# Patient Record
Sex: Female | Born: 1960 | Race: White | Hispanic: No | Marital: Single | Smoking: Former smoker
Health system: Southern US, Community
[De-identification: ages and names within clinical notes are randomized; demographics above are authoritative.]

## PROBLEM LIST (undated history)

## (undated) DIAGNOSIS — J449 Chronic obstructive pulmonary disease, unspecified: Secondary | ICD-10-CM

## (undated) DIAGNOSIS — J439 Emphysema, unspecified: Secondary | ICD-10-CM

## (undated) HISTORY — PX: APPENDECTOMY: SHX54

---

## 2020-08-30 ENCOUNTER — Other Ambulatory Visit: Payer: Self-pay

## 2020-08-30 ENCOUNTER — Emergency Department
Admission: EM | Admit: 2020-08-30 | Discharge: 2020-08-30 | Disposition: A | Discharge status: Home / Self Care | Payer: Self-pay | Source: Home / Self Care

## 2020-08-30 DIAGNOSIS — R0789 Other chest pain: Secondary | ICD-10-CM | POA: Diagnosis not present

## 2020-08-30 DIAGNOSIS — T148XXA Other injury of unspecified body region, initial encounter: Secondary | ICD-10-CM

## 2020-08-30 DIAGNOSIS — M5412 Radiculopathy, cervical region: Secondary | ICD-10-CM

## 2020-08-30 MED ORDER — DEXAMETHASONE SODIUM PHOSPHATE 10 MG/ML IJ SOLN
10.0000 mg | Freq: Once | INTRAMUSCULAR | Status: AC
  Administered 2020-08-30: 10 mg via INTRAMUSCULAR

## 2020-08-30 MED ORDER — KETOROLAC TROMETHAMINE 30 MG/ML IJ SOLN
30.0000 mg | Freq: Once | INTRAMUSCULAR | Status: AC
  Administered 2020-08-30: 30 mg via INTRAMUSCULAR

## 2020-08-30 MED ORDER — PREDNISONE 10 MG (21) PO TBPK: ORAL_TABLET | Freq: Every day | ORAL | 0 refills | Status: AC

## 2020-08-30 NOTE — ED Provider Notes (Signed)
[facility]    CSN: [id number] Arrival date & time: 08/30/20  1511      History   Chief Complaint Chief Complaint  Patient presents with  . Chest Pain    X3 days    HPI Penny Jenkins is a 60 y.o. female.   Reports left sided chest pain and left arm numbness and tingling intermittently for the last 3 days. Reports recent fall at work but that she did not catch herself with her left side. Reports that she has had musculoskeletal pain issues in the past and has previously diagnosed with polymyalgia and bursitis of both hips. Has been taking ibuprofen for this with little relief. Reports that her left chest is tender to touch and that pain is worse with deep breathing. Denies limited ROM. Denies fatigue, diaphoresis, changes in strength, changes in vision, headaches, back pain, jaw pain, epigastric pain. Denies personal cardiac history, does not take prescription medications for anything. Denies family cardiac history as well.  ROS per HPI  The history is provided by the patient.    History reviewed. No pertinent past medical history.  There are no problems to display for this patient.   History reviewed. No pertinent surgical history.  OB History   No obstetric history on file.      Home Medications    Prior to Admission medications   Medication Sig Start Date End Date Taking? Authorizing Provider  predniSONE (STERAPRED UNI-PAK 21 TAB) 10 MG (21) TBPK tablet Take by mouth daily for 6 days. Take 6 tablets on day 1, 5 tablets on day 2, 4 tablets on day 3, 3 tablets on day 4, 2 tablets on day 5, 1 tablet on day 6 08/30/20 09/05/20 Yes Moshe Cipro, NP    Family History Family History  Problem Relation Age of Onset  . Hypertension Mother   . Asthma Mother     Social History Social History   Tobacco Use  . Smoking status: Never Smoker  . Smokeless tobacco: Never Used  Substance Use Topics  . Alcohol use: Not Currently     Allergies    Codeine   Review of Systems Review of Systems   Physical Exam Triage Vital Signs ED Triage Vitals  Enc Vitals Group     BP 08/30/20 1525 125/83     Pulse Rate 08/30/20 1525 64     Resp 08/30/20 1525 16     Temp 08/30/20 1525 98.1 F (36.7 C)     Temp Source 08/30/20 1525 Oral     SpO2 08/30/20 1525 99 %     Weight --      Height --      Head Circumference --      Peak Flow --      Pain Score 08/30/20 1523 4     Pain Loc --      Pain Edu? --      Excl. in GC? --    No data found.  Updated Vital Signs BP 125/83 (BP Location: Right Arm)   Pulse 64   Temp 98.1 F (36.7 C) (Oral)   Resp 16   SpO2 99%      Physical Exam Vitals and nursing note reviewed.  Constitutional:      General: She is not in acute distress.    Appearance: She is well-developed. She is not ill-appearing, toxic-appearing or diaphoretic.  HENT:     Head: Normocephalic and atraumatic.  Eyes:     Conjunctiva/sclera: Conjunctivae normal.  Cardiovascular:     Rate and Rhythm: Normal rate and regular rhythm.     Pulses:          Carotid pulses are 2+ on the right side and 2+ on the left side.      Radial pulses are 2+ on the right side and 2+ on the left side.       Dorsalis pedis pulses are 2+ on the right side and 2+ on the left side.     Heart sounds: Normal heart sounds. Heart sounds not distant. No murmur heard.  No systolic murmur is present.  No diastolic murmur is present. No friction rub. No gallop. No S3 or S4 sounds.   Pulmonary:     Effort: Pulmonary effort is normal. No tachypnea, accessory muscle usage or respiratory distress.     Breath sounds: Normal breath sounds. No stridor.  Abdominal:     Palpations: Abdomen is soft.     Tenderness: There is no abdominal tenderness.  Musculoskeletal:     Cervical back: Neck supple.     Right lower leg: No tenderness. No edema.     Left lower leg: No tenderness. No edema.     Comments: Left chest wall tenderness, posterior L neck  tenderness  Skin:    General: Skin is warm and dry.     Capillary Refill: Capillary refill takes less than 2 seconds.  Neurological:     General: No focal deficit present.     Mental Status: She is alert.  Psychiatric:        Mood and Affect: Mood normal.        Behavior: Behavior normal.      UC Treatments / Results  Labs (all labs ordered are listed, but only abnormal results are displayed) Labs Reviewed - No data to display  EKG   Radiology No results found.  Procedures Procedures (including critical care time)  Medications Ordered in UC Medications  dexamethasone (DECADRON) injection 10 mg (10 mg Intramuscular Given 08/30/20 1558)  ketorolac (TORADOL) 30 MG/ML injection 30 mg (30 mg Intramuscular Given 08/30/20 1559)    Initial Impression / Assessment and Plan / UC Course  I have reviewed the triage vital signs and the nursing notes.  Pertinent labs & imaging results that were available during my care of the patient were reviewed by me and considered in my medical decision making (see chart for details).    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 We have also given you decadron as a steroid.  I have sent in a prednisone taper for you to take for 6 days. 6 tablets on day one, 5 tablets on day  two, 4 tablets on day three, 3 tablets on day four, 2 tablets on day five, and 1 tablet  on day six.  Follow up with this office or with primary care if symptoms are persisting.  Follow up in the ER for high fever, trouble swallowing, trouble breathing, other concerning symptoms.     ED Prescriptions    Medication Sig Dispense Auth. Provider   predniSONE (STERAPRED UNI-PAK 21 TAB) 10 MG (21) TBPK tablet Take by mouth daily for 6 days. Take 6 tablets on day 1, 5 tablets on day 2, 4 tablets on day 3, 3 tablets on day 4, 2 tablets on day 5, 1 tablet on day 6 21 tablet Moshe Cipro, NP     PDMP not reviewed this encounter.   Moshe Cipro, NP 08/30/20 1600

## 2020-08-30 NOTE — ED Triage Notes (Signed)
Patient presents to Urgent Care with complaints of sharp chest pain that occasionally radiates down her left arm since 3-4 days ago. Patient reports the pain sometimes wakes her up out of her sleep. Denies cardiac history.

## 2020-08-30 NOTE — Discharge Instructions (Addendum)
We have given you toradol as an injection for pain  in the office today. We have also given you decadron as a steroid.  I have sent in a prednisone taper for you to take for 6 days. 6 tablets on day one, 5 tablets on day two, 4 tablets on day three, 3 tablets on day four, 2 tablets on day five, and 1 tablet on day six.  Follow up with this office or with primary care if symptoms are persisting.  Follow up in the ER for high fever, trouble swallowing, trouble breathing, other concerning symptoms.

## 2020-11-07 ENCOUNTER — Other Ambulatory Visit: Payer: Self-pay

## 2020-11-07 ENCOUNTER — Emergency Department
Admission: EM | Admit: 2020-11-07 | Discharge: 2020-11-07 | Disposition: A | Discharge status: Home / Self Care | Payer: 59 | Source: Home / Self Care

## 2020-11-07 ENCOUNTER — Emergency Department (INDEPENDENT_AMBULATORY_CARE_PROVIDER_SITE_OTHER): Payer: 59

## 2020-11-07 DIAGNOSIS — M79671 Pain in right foot: Secondary | ICD-10-CM | POA: Diagnosis not present

## 2020-11-07 DIAGNOSIS — S92535A Nondisplaced fracture of distal phalanx of left lesser toe(s), initial encounter for closed fracture: Secondary | ICD-10-CM | POA: Diagnosis not present

## 2020-11-07 DIAGNOSIS — W19XXXA Unspecified fall, initial encounter: Secondary | ICD-10-CM | POA: Diagnosis not present

## 2020-11-07 NOTE — ED Provider Notes (Signed)
[facility]    CSN: [id number] Arrival date & time: 11/07/20  0800      History   Chief Complaint Chief Complaint  Patient presents with  . Foot Injury    HPI Penny Jenkins is a 60 y.o. female.   [place].  Pt complains of swelling and pain   The history is provided by the patient. No language interpreter was used.  Foot Injury Location:  Toe Injury: yes   Mechanism of injury: fall   Fall:    Entrapped after fall: no   Toe location:  L fourth toe and L little toe   History reviewed. No pertinent past medical history.  There are no problems to display for this patient.   Past Surgical History:  Procedure Laterality Date  . APPENDECTOMY    . CESAREAN SECTION      OB History   No obstetric history on file.      Home Medications    Prior to Admission medications   Medication Sig Start Date End Date Taking? Authorizing Provider  ibuprofen (ADVIL) 400 MG tablet Take 400 mg by mouth every 6 (six) hours as needed.   Yes [provider]    Family History Family History  Problem Relation Age of Onset  . Hypertension Mother   . Asthma Mother   . Cancer Father     Social History Social History   Tobacco Use  . Smoking status: Former Games developer  . Smokeless tobacco: Never Used  Vaping Use  . Vaping Use: Never used  Substance Use Topics  . Alcohol use: Not Currently  . Drug use: Not Currently     Allergies   Codeine   Review of Systems Review of Systems  All other systems reviewed and are negative.    Physical Exam Triage Vital Signs ED Triage Vitals  Enc Vitals Group     BP 11/07/20 0817 (!) 130/93     Pulse Rate 11/07/20 0817 72     Resp 11/07/20 0817 18     Temp 11/07/20 0817 98.3 F (36.8 C)     Temp Source 11/07/20 0817 Oral     SpO2 11/07/20 0817 97 %     Weight 11/07/20 0816 150 lb (68 kg)     Height 11/07/20 0816 5\' 5"  (1.651 m)     Head Circumference --       Peak Flow --      Pain Score 11/07/20 0815 5     Pain Loc --      Pain Edu? --      Excl. in GC? --    No data found.  Updated Vital Signs BP (!) 130/93 (BP Location: Right Arm)   Pulse 72   Temp 98.3 F (36.8 C) (Oral)   Resp 18   Ht 5\' 5"  (1.651 m)   Wt 68 kg   SpO2 97%   BMI 24.96 kg/m   Visual Acuity Right Eye Distance:   Left Eye Distance:   Bilateral Distance:    Right Eye Near:   Left Eye Near:    Bilateral Near:     Physical Exam Vitals and nursing note reviewed.  Constitutional:      Appearance: She is well-developed.  HENT:     Head: Normocephalic.  Pulmonary:     Effort: Pulmonary effort is normal.  Abdominal:     General: There is no distension.  Musculoskeletal:  General: Swelling and tenderness present.     Cervical back: Normal range of motion.     Comments: Left 4th and 5th toe  From nv and ns intact  Skin:    General: Skin is warm.  Neurological:     General: No focal deficit present.     Mental Status: She is alert and oriented to person, place, and time.  Psychiatric:        Mood and Affect: Mood normal.      UC Treatments / Results  Labs (all labs ordered are listed, but only abnormal results are displayed) Labs Reviewed - No data to display  EKG   Radiology DG Foot Complete Right  Result Date: 11/07/2020 CLINICAL DATA:  Pain following fall EXAM: RIGHT FOOT COMPLETE - 3+ VIEW COMPARISON:  None. FINDINGS: Frontal, oblique, and lateral views were obtained. There is a subtle transversely oriented fracture of the distal aspect of the fifth proximal phalanx. Alignment anatomic in this area. No other fracture. No dislocation. No appreciable joint space narrowing or erosion. There is an inferior calcaneal spur. Mild bunion formation medial first MTP joint level. IMPRESSION: Nondisplaced fracture of the distal aspect of the fifth proximal phalanx. No other evident fracture. No dislocation. Inferior calcaneal spur. Mild bunion  formation medial first MTP joint level. These results will be called to the ordering clinician or representative by the Radiologist Assistant, and communication documented in the PACS or [employer]. Electronically Signed   By: Bretta Bang III M.D.   On: 11/07/2020 08:37    Procedures Procedures (including critical care time)  Medications Ordered in UC Medications - No data to display  Initial Impression / Assessment and Plan / UC Course  I have reviewed the triage vital signs and the nursing notes.  Pertinent labs & imaging results that were available during my care of the patient were reviewed by me and considered in my medical decision making (see chart for details).      Final Clinical Impressions(s) / UC Diagnoses   Final diagnoses:  Closed nondisplaced fracture of distal phalanx of lesser toe of left foot, initial encounter   Discharge Instructions   None    ED Prescriptions    None     PDMP not reviewed this encounter.   Elson Areas, [place] 11/07/20 0254

## 2020-11-07 NOTE — Discharge Instructions (Signed)
Return if any problems.

## 2020-11-07 NOTE — ED Triage Notes (Signed)
Pt presents to Urgent Care with c/o R 4th and 5th toe pain following injury yesterday. [place]. Swelling and redness noted to both toes.

## 2021-05-08 ENCOUNTER — Encounter: Payer: Self-pay | Admitting: Emergency Medicine

## 2021-05-08 ENCOUNTER — Emergency Department
Admission: EM | Admit: 2021-05-08 | Discharge: 2021-05-08 | Disposition: A | Discharge status: Home / Self Care | Payer: Self-pay | Source: Home / Self Care

## 2021-05-08 ENCOUNTER — Other Ambulatory Visit: Payer: Self-pay

## 2021-05-08 DIAGNOSIS — B349 Viral infection, unspecified: Secondary | ICD-10-CM

## 2021-05-08 DIAGNOSIS — R509 Fever, unspecified: Secondary | ICD-10-CM

## 2021-05-08 DIAGNOSIS — R059 Cough, unspecified: Secondary | ICD-10-CM

## 2021-05-08 MED ORDER — BENZONATATE 200 MG PO CAPS: 200.0000 mg | ORAL_CAPSULE | Freq: Three times a day (TID) | ORAL | 0 refills | Status: AC | PRN

## 2021-05-08 MED ORDER — OSELTAMIVIR PHOSPHATE 75 MG PO CAPS: 75.0000 mg | ORAL_CAPSULE | Freq: Two times a day (BID) | ORAL | 0 refills | Status: DC

## 2021-05-08 MED ORDER — PREDNISONE 20 MG PO TABS: ORAL_TABLET | ORAL | 0 refills | Status: DC

## 2021-05-08 NOTE — ED Provider Notes (Signed)
[facility]    CSN: [id number] Arrival date & time: 05/08/21  0808      History   Chief Complaint Chief Complaint  Patient presents with   Fever   Cough    HPI Penny Jenkins is a 60 y.o. female.   HPI 60 year old female presents with fever, cough, congestion for 3 days.  Reports temperature max last night was 100.9.  Patient is currently at 99.9.  History reviewed. No pertinent past medical history.  There are no problems to display for this patient.   Past Surgical History:  Procedure Laterality Date   APPENDECTOMY     CESAREAN SECTION      OB History   No obstetric history on file.      Home Medications    Prior to Admission medications   Medication Sig Start Date End Date Taking? Authorizing Provider  benzonatate (TESSALON) 200 MG capsule Take 1 capsule (200 mg total) by mouth 3 (three) times daily as needed for up to 7 days for cough. 05/08/21 05/15/21 Yes Trevor Iha, FNP  oseltamivir (TAMIFLU) 75 MG capsule Take 1 capsule (75 mg total) by mouth every 12 (twelve) hours. 05/08/21  Yes Trevor Iha, FNP  predniSONE (DELTASONE) 20 MG tablet Take 3 tabs PO daily x 5 days. 05/08/21  Yes Trevor Iha, FNP  ibuprofen (ADVIL) 400 MG tablet Take 400 mg by mouth every 6 (six) hours as needed.    [provider]    Family History Family History  Problem Relation Age of Onset   Hypertension Mother    Asthma Mother    Cancer Father     Social History Social History   Tobacco Use   Smoking status: Former   Smokeless tobacco: Never  Building services engineer Use: Never used  Substance Use Topics   Alcohol use: Not Currently   Drug use: Not Currently     Allergies   Codeine   Review of Systems Review of Systems  Constitutional:  Positive for fever.  HENT:  Positive for congestion.   Respiratory:  Positive for cough.   All other systems reviewed and are negative.   Physical Exam Triage Vital Signs ED Triage Vitals   Enc Vitals Group     BP 05/08/21 0820 107/75     Pulse Rate 05/08/21 0820 90     Resp 05/08/21 0820 16     Temp 05/08/21 0820 99.9 F (37.7 C)     Temp Source 05/08/21 0820 Oral     SpO2 05/08/21 0820 97 %     Weight 05/08/21 0823 160 lb (72.6 kg)     Height 05/08/21 0823 5\' 5"  (1.651 m)     Head Circumference --      Peak Flow --      Pain Score 05/08/21 0822 4     Pain Loc --      Pain Edu? --      Excl. in GC? --    No data found.  Updated Vital Signs BP 107/75 (BP Location: Left Arm)   Pulse 90   Temp 99.9 F (37.7 C) (Oral)   Resp 16   Ht 5\' 5"  (1.651 m)   Wt 160 lb (72.6 kg)   SpO2 97%   BMI 26.63 kg/m   Physical Exam Vitals and nursing note reviewed.  Constitutional:      General: She is not in acute distress.    Appearance: Normal appearance. She is normal weight. She is not  ill-appearing.  HENT:     Head: Normocephalic and atraumatic.     Right Ear: Tympanic membrane, ear canal and external ear normal.     Left Ear: Tympanic membrane, ear canal and external ear normal.     Mouth/Throat:     Mouth: Mucous membranes are moist.     Pharynx: Oropharynx is clear.  Eyes:     Extraocular Movements: Extraocular movements intact.     Conjunctiva/sclera: Conjunctivae normal.     Pupils: Pupils are equal, round, and reactive to light.  Cardiovascular:     Rate and Rhythm: Normal rate and regular rhythm.     Pulses: Normal pulses.     Heart sounds: Normal heart sounds.  Pulmonary:     Effort: Pulmonary effort is normal.     Breath sounds: Normal breath sounds. No wheezing, rhonchi or rales.     Comments: Infrequent nonproductive cough noted on exam Musculoskeletal:        General: Normal range of motion.     Cervical back: Normal range of motion and neck supple.  Skin:    General: Skin is warm and dry.  Neurological:     General: No focal deficit present.     Mental Status: She is alert and oriented to person, place, and time.     UC Treatments /  Results  Labs (all labs ordered are listed, but only abnormal results are displayed) Labs Reviewed - No data to display  EKG   Radiology No results found.  Procedures Procedures (including critical care time)  Medications Ordered in UC Medications - No data to display  Initial Impression / Assessment and Plan / UC Course  I have reviewed the triage vital signs and the nursing notes.  Pertinent labs & imaging results that were available during my care of the patient were reviewed by me and considered in my medical decision making (see chart for details).     MDM: 1.  Fever-advised patient may use OTC Tylenol 1000 mg 1-2 times daily, as needed; 2.  Viral illness-Rx Tamiflu; 3.  Cough-Rx'd prednisone burst and Tessalon Perles. Advised patient to take medication as directed with food to completion.  Encouraged patient to increase daily water intake while taking these medications.  Provided per patient request.  Patient discharged home, hemodynamically stable. Final Clinical Impressions(s) / UC Diagnoses   Final diagnoses:  Fever, unspecified  Viral illness  Cough, unspecified type     Discharge Instructions      Advised patient to take medication as directed with food to completion.  Advised patient may use OTC Tylenol 1000 mg 1-2 times daily, as needed. Encouraged patient to increase daily water intake while taking these medications.     ED Prescriptions     Medication Sig Dispense Auth. Provider   oseltamivir (TAMIFLU) 75 MG capsule Take 1 capsule (75 mg total) by mouth every 12 (twelve) hours. 10 capsule Trevor Iha, FNP   predniSONE (DELTASONE) 20 MG tablet Take 3 tabs PO daily x 5 days. 15 tablet Trevor Iha, FNP   benzonatate (TESSALON) 200 MG capsule Take 1 capsule (200 mg total) by mouth 3 (three) times daily as needed for up to 7 days for cough. 40 capsule Trevor Iha, FNP      PDMP not reviewed this encounter.   Trevor Iha, FNP 05/08/21  [phone]

## 2021-05-08 NOTE — ED Triage Notes (Signed)
Fever last night 100.9  Cough & congestion since sunday  Thera flu OTC  Negative home covid x 2 - last one was yesterday  Headache & nausea - last Wed - 1st Covid test was negative  No flu or Covid vaccine

## 2021-05-08 NOTE — Discharge Instructions (Addendum)
Advised patient to take medication as directed with food to completion.  Advised patient may use OTC Tylenol 1000 mg 1-2 times daily, as needed. Encouraged patient to increase daily water intake while taking these medications.

## 2021-09-11 ENCOUNTER — Emergency Department
Admission: EM | Admit: 2021-09-11 | Discharge: 2021-09-11 | Disposition: A | Discharge status: Home / Self Care | Payer: Self-pay | Source: Home / Self Care

## 2021-09-11 ENCOUNTER — Emergency Department (INDEPENDENT_AMBULATORY_CARE_PROVIDER_SITE_OTHER): Payer: Self-pay

## 2021-09-11 DIAGNOSIS — M7989 Other specified soft tissue disorders: Secondary | ICD-10-CM

## 2021-09-11 NOTE — ED Triage Notes (Signed)
Pt c/o lower RT leg swelling x 1 week. States swells all the way up to her RT knee. Sometimes toes start to tingle. Had some leg swelling several years ago, resolved on its own. ?

## 2021-09-11 NOTE — ED Provider Notes (Signed)
?[facility] ? ? ? ?CSN: [id number] ?Arrival date & time: 09/11/21  2202 ? ? ?  ? ?History   ?Chief Complaint ?Chief Complaint  ?Patient presents with  ? Leg Swelling  ?  RT  ? ? ?HPI ?Penny Jenkins is a 61 y.o. female.  ? ?HPI 61 year old female presents with right lower leg swelling for for 1 week.  Patient reports swelling goes all the way to her right knee.  Reports infrequently feels her toes starting to 10.  Patient reports similar episode several years ago which resolved on its own.  PMH significant for polymyalgia rheumatica.  Patient reports that she works for [employer] but mainly sits behind a desk during the workday.  Patient denies anginal equivalents, claudication, shortness of breath, lightheadedness/dizziness, presyncopal or syncopal episodes. ? ?History reviewed. No pertinent past medical history. ? ?There are no problems to display for this patient. ? ? ?Past Surgical History:  ?Procedure Laterality Date  ? APPENDECTOMY    ? CESAREAN SECTION    ? ? ?OB History   ?No obstetric history on file. ?  ? ? ? ?Home Medications   ? ?Prior to Admission medications   ?Medication Sig Start Date End Date Taking? Authorizing Provider  ?ibuprofen (ADVIL) 400 MG tablet Take 400 mg by mouth every 6 (six) hours as needed.    [provider]  ?oseltamivir (TAMIFLU) 75 MG capsule Take 1 capsule (75 mg total) by mouth every 12 (twelve) hours. 05/08/21   Trevor Iha, FNP  ?predniSONE (DELTASONE) 20 MG tablet Take 3 tabs PO daily x 5 days. 05/08/21   Trevor Iha, FNP  ? ? ?Family History ?Family History  ?Problem Relation Age of Onset  ? Hypertension Mother   ? Asthma Mother   ? Cancer Father   ? ? ?Social History ?Social History  ? ?Tobacco Use  ? Smoking status: Former  ? Smokeless tobacco: Never  ?Vaping Use  ? Vaping Use: Never used  ?Substance Use Topics  ? Alcohol use: Not Currently  ? Drug use: Not Currently  ? ? ? ?Allergies   ?Codeine ? ? ?Review of Systems ?Review of Systems   ?Musculoskeletal:  Positive for joint swelling.  ?     Right lower leg swelling for 1 week  ?All other systems reviewed and are negative. ? ? ?Physical Exam ?Triage Vital Signs ?ED Triage Vitals  ?Enc Vitals Group  ?   BP 09/11/21 0903 112/75  ?   Pulse Rate 09/11/21 0903 75  ?   Resp 09/11/21 0903 18  ?   Temp 09/11/21 0903 98.3 ?F (36.8 ?C)  ?   Temp Source 09/11/21 0903 Oral  ?   SpO2 09/11/21 0903 97 %  ?   Weight --   ?   Height --   ?   Head Circumference --   ?   Peak Flow --   ?   Pain Score 09/11/21 0905 3  ?   Pain Loc --   ?   Pain Edu? --   ?   Excl. in GC? --   ? ?No data found. ? ?Updated Vital Signs ?BP 112/75 (BP Location: Right Arm)   Pulse 75   Temp 98.3 ?F (36.8 ?C) (Oral)   Resp 18   SpO2 97%  ? ? ?Physical Exam ?Vitals and nursing note reviewed.  ?Constitutional:   ?   General: She is not in acute distress. ?   Appearance: Normal appearance. She is normal weight. She  is not ill-appearing.  ?HENT:  ?   Head: Normocephalic and atraumatic.  ?   Mouth/Throat:  ?   Mouth: Mucous membranes are moist.  ?   Pharynx: Oropharynx is clear.  ?Eyes:  ?   Extraocular Movements: Extraocular movements intact.  ?   Conjunctiva/sclera: Conjunctivae normal.  ?   Pupils: Pupils are equal, round, and reactive to light.  ?Cardiovascular:  ?   Rate and Rhythm: Normal rate and regular rhythm.  ?   Pulses: Normal pulses.  ?   Heart sounds: Normal heart sounds. No murmur heard. ?   Comments: Right/Left PT/DP +1 bounding ?Pulmonary:  ?   Effort: Pulmonary effort is normal.  ?   Breath sounds: Normal breath sounds. No wheezing, rhonchi or rales.  ?Musculoskeletal:  ?   Cervical back: Normal range of motion and neck supple.  ?   Right lower leg: Edema present.  ?   Comments: Right lower leg circumference at mid gastroc-15.25 inches; left lower leg circumference at mid gastroc-14.75 inches, superficial thrombophlebitis and varicose veins noted  ?Skin: ?   General: Skin is warm and dry.  ?Neurological:  ?   General: No  focal deficit present.  ?   Mental Status: She is alert and oriented to person, place, and time.  ? ? ? ?UC Treatments / Results  ?Labs ?(all labs ordered are listed, but only abnormal results are displayed) ?Labs Reviewed - No data to display ? ?EKG ? ? ?Radiology ?US Venous Img Lower Unilateral Right ? ?Result Date: 09/11/2021 ?CLINICAL DATA:  61 year old female with a history of right lower leg swelling EXAM: RIGHT LOWER EXTREMITY VENOUS DOPPLER ULTRASOUND TECHNIQUE: Gray-scale sonography with graded compression, as well as color Doppler and duplex ultrasound were performed to evaluate the lower extremity deep venous systems from the level of the common femoral vein and including the common femoral, femoral, profunda femoral, popliteal and calf veins including the posterior tibial, peroneal and gastrocnemius veins when visible. The superficial great saphenous vein was also interrogated. Spectral Doppler was utilized to evaluate flow at rest and with distal augmentation maneuvers in the common femoral, femoral and popliteal veins. COMPARISON:  None. FINDINGS: Contralateral Common Femoral Vein: Respiratory phasicity is normal and symmetric with the symptomatic side. No evidence of thrombus. Normal compressibility. Common Femoral Vein: No evidence of thrombus. Normal compressibility, respiratory phasicity and response to augmentation. Saphenofemoral Junction: No evidence of thrombus. Normal compressibility and flow on color Doppler imaging. Profunda Femoral Vein: No evidence of thrombus. Normal compressibility and flow on color Doppler imaging. Femoral Vein: No evidence of thrombus. Normal compressibility, respiratory phasicity and response to augmentation. Popliteal Vein: No evidence of thrombus. Normal compressibility, respiratory phasicity and response to augmentation. Calf Veins: Limited visualization of the peroneal vein. Posterior tibial veins patent and compressible with flow maintained Superficial Great  Saphenous Vein: No evidence of thrombus. Normal compressibility and flow on color Doppler imaging. Other Findings:  None. IMPRESSION: Directed duplex of the right lower extremity negative for DVT Electronically Signed   By: Gilmer MorJaime  Wagner D.O.   On: 09/11/2021 10:42   ? ?Procedures ?Procedures (including critical care time) ? ?Medications Ordered in UC ?Medications - No data to display ? ?Initial Impression / Assessment and Plan / UC Course  ?I have reviewed the triage vital signs and the nursing notes. ? ?Pertinent labs & imaging results that were available during my care of the patient were reviewed by me and considered in my medical decision making (see chart for details). ? ?  ? ?  MDM: 1.  Right leg swelling-ultrasound negative for DVT. Advised/informed patient of ultrasound results today.  Advised patient if symptoms worsen and/or unresolved please follow-up with PCP or here for further evaluation.  Patient discharged home, hemodynamically stable. ?Final Clinical Impressions(s) / UC Diagnoses  ? ?Final diagnoses:  ?Right leg swelling  ? ? ? ?Discharge Instructions   ? ?  ?Advised/informed patient of ultrasound results today.  Advised patient if symptoms worsen and/or unresolved please follow-up with PCP or here for further evaluation. ? ? ? ? ?ED Prescriptions   ?None ?  ? ?PDMP not reviewed this encounter. ?  ?Trevor Iha, FNP ?09/11/21 1117 ? ?

## 2021-09-11 NOTE — Discharge Instructions (Addendum)
Advised/informed patient of ultrasound results today.  Advised patient if symptoms worsen and/or unresolved please follow-up with PCP or here for further evaluation. ?

## 2022-05-28 ENCOUNTER — Encounter: Payer: Self-pay | Admitting: Emergency Medicine

## 2022-05-28 ENCOUNTER — Ambulatory Visit
Admission: EM | Admit: 2022-05-28 | Discharge: 2022-05-28 | Disposition: A | Discharge status: Home / Self Care | Payer: Self-pay | Attending: Family Medicine | Admitting: Family Medicine

## 2022-05-28 DIAGNOSIS — Z20828 Contact with and (suspected) exposure to other viral communicable diseases: Secondary | ICD-10-CM | POA: Insufficient documentation

## 2022-05-28 DIAGNOSIS — J209 Acute bronchitis, unspecified: Secondary | ICD-10-CM | POA: Insufficient documentation

## 2022-05-28 DIAGNOSIS — Z1152 Encounter for screening for COVID-19: Secondary | ICD-10-CM | POA: Insufficient documentation

## 2022-05-28 LAB — RESP PANEL BY RT-PCR (FLU A&B, COVID) ARPGX2
Influenza A by PCR: NEGATIVE
Influenza B by PCR: NEGATIVE
SARS Coronavirus 2 by RT PCR: NEGATIVE

## 2022-05-28 MED ORDER — BENZONATATE 200 MG PO CAPS: 200.0000 mg | ORAL_CAPSULE | Freq: Three times a day (TID) | ORAL | 0 refills | Status: DC | PRN

## 2022-05-28 MED ORDER — PREDNISONE 20 MG PO TABS: 20.0000 mg | ORAL_TABLET | Freq: Two times a day (BID) | ORAL | 0 refills | Status: DC

## 2022-05-28 NOTE — Discharge Instructions (Signed)
Make sure you are drinking lots of fluids Take the Tessalon 2-3 times a day as needed for cough May take in addition Mucinex DM if needed Take prednisone 2 times a day.  This reduces inflammation and swelling Check MyChart for test results.  You will be called if any of your results are positive

## 2022-05-28 NOTE — ED Triage Notes (Addendum)
Cough since Friday night at a friends house EMS called - no trx Coughing started after lighting candles Steroid shot to R shoulder 2 days prior  Denies fever Honey OTC  Exposure to RSV

## 2022-05-28 NOTE — ED Provider Notes (Signed)
[facility]    CSN: [id number] Arrival date & time: 05/28/22  0933      History   Chief Complaint Chief Complaint  Patient presents with   Cough    HPI Penny Jenkins is a 61 y.o. female.   HPI  Patient states she developed a cough on Friday.  It was when she was visiting a friend who had numerous candles that were very aromatic.  At first she thought she was coughing due to the fragrance.  She has continued to cough.  Some runny nose.  She does have exposure to RSV, her mother is being treated for pneumonia and RSV.  He does not get vaccinations.  Has not had COVID or influenza shots.  Has not noticed any fever.  She does feel tired  History reviewed. No pertinent past medical history.  There are no problems to display for this patient.   Past Surgical History:  Procedure Laterality Date   APPENDECTOMY     CESAREAN SECTION      OB History   No obstetric history on file.      Home Medications    Prior to Admission medications   Medication Sig Start Date End Date Taking? Authorizing Provider  benzonatate (TESSALON) 200 MG capsule Take 1 capsule (200 mg total) by mouth 3 (three) times daily as needed for cough. 05/28/22  Yes Eustace Moore, MD  predniSONE (DELTASONE) 20 MG tablet Take 1 tablet (20 mg total) by mouth 2 (two) times daily with a meal. 05/28/22  Yes Eustace Moore, MD    Family History Family History  Problem Relation Age of Onset   Hypertension Mother    Asthma Mother    Cancer Father     Social History Social History   Tobacco Use   Smoking status: Former   Smokeless tobacco: Never  Building services engineer Use: Never used  Substance Use Topics   Alcohol use: Not Currently   Drug use: Not Currently     Allergies   Codeine   Review of Systems Review of Systems See HPI  Physical Exam Triage Vital Signs ED Triage Vitals  Enc Vitals Group     BP 05/28/22 1107 (!) 151/92     Pulse Rate 05/28/22 1107 84      Resp 05/28/22 1107 16     Temp 05/28/22 1107 98.8 F (37.1 C)     Temp Source 05/28/22 1107 Oral     SpO2 05/28/22 1107 99 %     Weight 05/28/22 1108 160 lb 0.9 oz (72.6 kg)     Height 05/28/22 1108 5\' 5"  (1.651 m)     Head Circumference --      Peak Flow --      Pain Score 05/28/22 1105 3     Pain Loc --      Pain Edu? --      Excl. in GC? --    No data found.  Updated Vital Signs BP (!) 151/92 (BP Location: Left Arm)   Pulse 84   Temp 98.8 F (37.1 C) (Oral)   Resp 16   Ht 5\' 5"  (1.651 m)   Wt 72.6 kg   SpO2 99%   BMI 26.63 kg/m   Physical Exam Constitutional:      General: She is not in acute distress.    Appearance: Normal appearance. She is well-developed. She is not ill-appearing.  HENT:     Head: Normocephalic and atraumatic.  Right Ear: Tympanic membrane and external ear normal.     Left Ear: Tympanic membrane and external ear normal.     Nose: Nose normal. No congestion.     Mouth/Throat:     Mouth: Mucous membranes are moist.     Pharynx: No posterior oropharyngeal erythema.  Eyes:     Conjunctiva/sclera: Conjunctivae normal.     Pupils: Pupils are equal, round, and reactive to light.  Cardiovascular:     Rate and Rhythm: Normal rate and regular rhythm.     Heart sounds: Normal heart sounds.  Pulmonary:     Effort: Pulmonary effort is normal. No respiratory distress.     Breath sounds: Rhonchi present.     Comments: Expiratory rhonchi.  No wheeze Abdominal:     General: There is no distension.     Palpations: Abdomen is soft.  Musculoskeletal:        General: Normal range of motion.     Cervical back: Normal range of motion.  Lymphadenopathy:     Cervical: No cervical adenopathy.  Skin:    General: Skin is warm and dry.  Neurological:     Mental Status: She is alert.  Psychiatric:        Mood and Affect: Mood normal.        Behavior: Behavior normal.      UC Treatments / Results  Labs (all labs ordered are listed, but only  abnormal results are displayed) Labs Reviewed  RESP PANEL BY RT-PCR (FLU A&B, COVID) [id number]    EKG   Radiology No results found.  Procedures Procedures (including critical care time)  Medications Ordered in UC Medications - No data to display  Initial Impression / Assessment and Plan / UC Course  I have reviewed the triage vital signs and the nursing notes.  Pertinent labs & imaging results that were available during my care of the patient were reviewed by me and considered in my medical decision making (see chart for details).     Likely viral illness.  Viral swab is ordered Final Clinical Impressions(s) / UC Diagnoses   Final diagnoses:  Acute bronchitis, unspecified organism     Discharge Instructions      Make sure you are drinking lots of fluids Take the Tessalon 2-3 times a day as needed for cough May take in addition Mucinex DM if needed Take prednisone 2 times a day.  This reduces inflammation and swelling Check MyChart for test results.  You will be called if any of your results are positive   ED Prescriptions     Medication Sig Dispense Auth. Provider   benzonatate (TESSALON) 200 MG capsule Take 1 capsule (200 mg total) by mouth 3 (three) times daily as needed for cough. 21 capsule Eustace Moore, MD   predniSONE (DELTASONE) 20 MG tablet Take 1 tablet (20 mg total) by mouth 2 (two) times daily with a meal. 10 tablet Eustace Moore, MD      PDMP not reviewed this encounter.   Eustace Moore, MD 05/28/22 1200

## 2022-06-05 ENCOUNTER — Telehealth: Payer: Self-pay

## 2022-06-05 MED ORDER — AZITHROMYCIN 250 MG PO TABS: ORAL_TABLET | ORAL | 0 refills | Status: DC

## 2022-06-05 NOTE — Telephone Encounter (Signed)
Continues to cough and request antibiotic medication.  States her cough is productive.  Has not been coughing for over 2 weeks.  Will send in a Z-Pak.  Needs to see her PCP if continues sick next week

## 2022-06-08 ENCOUNTER — Other Ambulatory Visit: Payer: Self-pay

## 2022-06-08 ENCOUNTER — Ambulatory Visit
Admission: EM | Admit: 2022-06-08 | Discharge: 2022-06-08 | Disposition: A | Discharge status: Home / Self Care | Payer: Self-pay | Attending: Family Medicine | Admitting: Family Medicine

## 2022-06-08 ENCOUNTER — Ambulatory Visit (INDEPENDENT_AMBULATORY_CARE_PROVIDER_SITE_OTHER): Payer: Self-pay

## 2022-06-08 DIAGNOSIS — J441 Chronic obstructive pulmonary disease with (acute) exacerbation: Secondary | ICD-10-CM

## 2022-06-08 DIAGNOSIS — R0989 Other specified symptoms and signs involving the circulatory and respiratory systems: Secondary | ICD-10-CM

## 2022-06-08 DIAGNOSIS — R059 Cough, unspecified: Secondary | ICD-10-CM

## 2022-06-08 DIAGNOSIS — R053 Chronic cough: Secondary | ICD-10-CM

## 2022-06-08 MED ORDER — ALBUTEROL SULFATE HFA 108 (90 BASE) MCG/ACT IN AERS: 1.0000 | INHALATION_SPRAY | Freq: Four times a day (QID) | RESPIRATORY_TRACT | 0 refills | Status: DC | PRN

## 2022-06-08 MED ORDER — DOXYCYCLINE HYCLATE 100 MG PO CAPS
100.0000 mg | ORAL_CAPSULE | Freq: Two times a day (BID) | ORAL | 0 refills | Status: AC
Start: 2022-06-08 — End: 2022-06-18

## 2022-06-08 MED ORDER — PREDNISONE 10 MG (21) PO TBPK: ORAL_TABLET | Freq: Every day | ORAL | 0 refills | Status: DC

## 2022-06-08 NOTE — ED Triage Notes (Signed)
Pt c/o cough that's continued since UC visit on 12/19. Being tx currently with zpak but doesn't feel any improvement.  Coughing to the point of throwing up last night. COVID neg at home 2 days ago. taking tessalon prn.

## 2022-06-08 NOTE — Discharge Instructions (Addendum)
Advised patient of chest x-ray results with hardcopy provided to patient.  Advised of chronic emphysematous changes.  Instructed patient to discontinue Zithromax now.  Advised patient to take medications as directed with food to completion.  Advised patient to take Sterapred Unipak with first dose of doxycycline for the next 10 days.  Advised may use albuterol inhaler for shortness of breath and/or wheezing if evolves.  Encouraged patient to increase daily water intake to 64 ounces per day while taking these medications.  A Encouraged patient to follow-up with PCP for pulmonology consult, PFT and further evaluation of chronic emphysema/COPD.

## 2022-06-08 NOTE — ED Provider Notes (Signed)
[facility]    CSN: [id number] Arrival date & time: 06/08/22  1025      History   Chief Complaint Chief Complaint  Patient presents with   Cough   Back Pain    HPI Penny Jenkins is a 61 y.o. female.   HPI Very pleasant 61 year old female presents with continued cough from 05/28/2022.  Patient was evaluated here prescribed prednisone and Tessalon, then called in Zithromax days later.  Patient is currently taking Zithromax but cough has not gone away.  History reviewed. No pertinent past medical history.  There are no problems to display for this patient.   Past Surgical History:  Procedure Laterality Date   APPENDECTOMY     CESAREAN SECTION      OB History   No obstetric history on file.      Home Medications    Prior to Admission medications   Medication Sig Start Date End Date Taking? Authorizing Provider  albuterol (VENTOLIN HFA) 108 (90 Base) MCG/ACT inhaler Inhale 1-2 puffs into the lungs every 6 (six) hours as needed for wheezing or shortness of breath. 06/08/22  Yes Eliezer Lofts, FNP  azithromycin (ZITHROMAX Z-PAK) 250 MG tablet Take two pills today followed by one a day until gone 06/05/22   Raylene Everts, MD  benzonatate (TESSALON) 200 MG capsule Take 1 capsule (200 mg total) by mouth 3 (three) times daily as needed for cough. 05/28/22   Raylene Everts, MD  doxycycline (VIBRAMYCIN) 100 MG capsule Take 1 capsule (100 mg total) by mouth 2 (two) times daily for 10 days. 06/08/22 06/18/22 Yes Eliezer Lofts, FNP  predniSONE (STERAPRED UNI-PAK 21 TAB) 10 MG (21) TBPK tablet Take by mouth daily. Take 6 tabs by mouth daily  for 2 days, then 5 tabs for 2 days, then 4 tabs for 2 days, then 3 tabs for 2 days, 2 tabs for 2 days, then 1 tab by mouth daily for 2 days 06/08/22  Yes Eliezer Lofts, FNP    Family History Family History  Problem Relation Age of Onset   Hypertension Mother    Asthma Mother    Cancer Father     Social  History Social History   Tobacco Use   Smoking status: Former   Smokeless tobacco: Never  Scientific laboratory technician Use: Never used  Substance Use Topics   Alcohol use: Not Currently   Drug use: Not Currently     Allergies   Codeine   Review of Systems Review of Systems  Respiratory:  Positive for cough.   All other systems reviewed and are negative.    Physical Exam Triage Vital Signs ED Triage Vitals  Enc Vitals Group     BP 06/08/22 1132 112/82     Pulse Rate 06/08/22 1132 98     Resp 06/08/22 1132 16     Temp 06/08/22 1132 98.4 F (36.9 C)     Temp Source 06/08/22 1132 Oral     SpO2 06/08/22 1132 94 %     Weight 06/08/22 1133 148 lb (67.1 kg)     Height --      Head Circumference --      Peak Flow --      Pain Score 06/08/22 1133 8     Pain Loc --      Pain Edu? --      Excl. in [place]? --    No data found.  Updated Vital Signs BP 112/82 (BP Location: Right Arm)  Pulse 98   Temp 98.4 F (36.9 C) (Oral)   Resp 16   Wt 148 lb (67.1 kg)   SpO2 94%   BMI 24.63 kg/m   Visual Acuity Right Eye Distance:   Left Eye Distance:   Bilateral Distance:    Right Eye Near:   Left Eye Near:    Bilateral Near:     Physical Exam Vitals and nursing note reviewed.  Constitutional:      General: She is not in acute distress.    Appearance: Normal appearance. She is normal weight. She is ill-appearing.  HENT:     Head: Normocephalic and atraumatic.     Right Ear: Tympanic membrane, ear canal and external ear normal.     Left Ear: Tympanic membrane, ear canal and external ear normal.     Mouth/Throat:     Mouth: Mucous membranes are moist.     Pharynx: Oropharynx is clear.  Eyes:     Extraocular Movements: Extraocular movements intact.     Conjunctiva/sclera: Conjunctivae normal.     Pupils: Pupils are equal, round, and reactive to light.  Cardiovascular:     Rate and Rhythm: Normal rate and regular rhythm.     Pulses: Normal pulses.     Heart sounds: Normal  heart sounds. No murmur heard. Pulmonary:     Effort: Pulmonary effort is normal.     Breath sounds: Wheezing and rhonchi present. No rales.     Comments: Diffuse scattered rhonchi with wheezing and crackles at bases noted Musculoskeletal:        General: Normal range of motion.     Cervical back: Normal range of motion and neck supple.  Skin:    General: Skin is warm and dry.  Neurological:     General: No focal deficit present.     Mental Status: She is alert and oriented to person, place, and time.      UC Treatments / Results  Labs (all labs ordered are listed, but only abnormal results are displayed) Labs Reviewed - No data to display  EKG   Radiology DG Chest 2 View  Result Date: 06/08/2022 CLINICAL DATA:  Cough for 3 weeks.  Congestion. EXAM: CHEST - 2 VIEW COMPARISON:  None Available. FINDINGS: Cardiac silhouette and mediastinal contours are within limits. Mild flattening of the diaphragms and hyperinflation. Increased lucencies within the bilateral upper lungs with attenuation of the pulmonary vasculature compatible with chronic emphysematous changes. The lungs are clear. No pleural effusion or pneumothorax. Mild multilevel degenerative disc changes of the thoracic spine. IMPRESSION: 1. No active cardiopulmonary disease. 2. Chronic emphysematous changes. Electronically Signed   By: Neita Garnet M.D.   On: 06/08/2022 11:52    Procedures Procedures (including critical care time)  Medications Ordered in UC Medications - No data to display  Initial Impression / Assessment and Plan / UC Course  I have reviewed the triage vital signs and the nursing notes.  Pertinent labs & imaging results that were available during my care of the patient were reviewed by me and considered in my medical decision making (see chart for details).     MDM: 1.  Chronic cough-Rx'd albuterol, doxycycline; 2.  COPD exacerbation-CXR revealed above, Rx'd Sterapred Unipak. Advised patient of  chest x-ray results with hardcopy provided to patient.  Advised of chronic emphysematous changes.  Instructed patient to discontinue Zithromax now.  Advised patient to take medications as directed with food to completion.  Advised patient to take Sterapred Unipak with first  dose of doxycycline for the next 10 days.  Advised may use albuterol inhaler for shortness of breath and/or wheezing if evolves.  Encouraged patient to increase daily water intake to 64 ounces per day while taking these medications.  A Encouraged patient to follow-up with PCP for pulmonology consult, PFT and further evaluation of chronic emphysema/COPD.  Patient discharged home, hemodynamically stable. Final Clinical Impressions(s) / UC Diagnoses   Final diagnoses:  Chronic cough  COPD exacerbation [facility])     Discharge Instructions      Advised patient of chest x-ray results with hardcopy provided to patient.  Advised of chronic emphysematous changes.  Instructed patient to discontinue Zithromax now.  Advised patient to take medications as directed with food to completion.  Advised patient to take Sterapred Unipak with first dose of doxycycline for the next 10 days.  Advised may use albuterol inhaler for shortness of breath and/or wheezing if evolves.  Encouraged patient to increase daily water intake to 64 ounces per day while taking these medications.  A Encouraged patient to follow-up with PCP for pulmonology consult, PFT and further evaluation of chronic emphysema/COPD.     ED Prescriptions     Medication Sig Dispense Auth. Provider   doxycycline (VIBRAMYCIN) 100 MG capsule Take 1 capsule (100 mg total) by mouth 2 (two) times daily for 10 days. 20 capsule Eliezer Lofts, FNP   predniSONE (STERAPRED UNI-PAK 21 TAB) 10 MG (21) TBPK tablet Take by mouth daily. Take 6 tabs by mouth daily  for 2 days, then 5 tabs for 2 days, then 4 tabs for 2 days, then 3 tabs for 2 days, 2 tabs for 2 days, then 1 tab by mouth daily for 2 days  42 tablet Eliezer Lofts, FNP   albuterol (VENTOLIN HFA) 108 (90 Base) MCG/ACT inhaler Inhale 1-2 puffs into the lungs every 6 (six) hours as needed for wheezing or shortness of breath. 1 each Eliezer Lofts, FNP      PDMP not reviewed this encounter.   Eliezer Lofts, [place] 06/08/22 1235

## 2022-06-09 ENCOUNTER — Ambulatory Visit: Payer: Self-pay

## 2023-08-24 ENCOUNTER — Other Ambulatory Visit: Payer: Self-pay

## 2023-08-24 ENCOUNTER — Ambulatory Visit (INDEPENDENT_AMBULATORY_CARE_PROVIDER_SITE_OTHER)

## 2023-08-24 ENCOUNTER — Ambulatory Visit
Admission: EM | Admit: 2023-08-24 | Discharge: 2023-08-24 | Disposition: A | Discharge status: Home / Self Care | Attending: Family Medicine | Admitting: Family Medicine

## 2023-08-24 DIAGNOSIS — R079 Chest pain, unspecified: Secondary | ICD-10-CM

## 2023-08-24 DIAGNOSIS — R0789 Other chest pain: Secondary | ICD-10-CM

## 2023-08-24 HISTORY — DX: Chronic obstructive pulmonary disease, unspecified: J44.9

## 2023-08-24 HISTORY — DX: Emphysema, unspecified: J43.9

## 2023-08-24 MED ORDER — PREDNISONE 50 MG PO TABS: ORAL_TABLET | ORAL | 0 refills | Status: DC

## 2023-08-24 NOTE — ED Provider Notes (Signed)
 [facility]    CSN: [id number] Arrival date & time: 08/24/23  0843      History   Chief Complaint Chief Complaint  Patient presents with   Chest Pain    HPI Penny Jenkins is a 63 y.o. female.   HPI  Penny Jenkins is here for chest pain.'s been present for a couple of days.  It is in the left chest and goes into the left arm.  It is not related to exertion.  It is there all the time.  It hurts when she presses on her chest.  She thinks it is muscular.  No pain with deep breath.  No associated symptoms such as dizziness lightheadedness or palpitations.  No history of heart disease.  No risk of heart disease due to hypertension, hyperlipidemia, diabetes, or family history.  She has a prior history of smoking but quit in 2013.  Has known COPD.  She states it started after she was working in her yard and doing some shoveling of mulch  Past Medical History:  Diagnosis Date   COPD (chronic obstructive pulmonary disease) (HCC)    Emphysema lung (HCC)     There are no active problems to display for this patient.   Past Surgical History:  Procedure Laterality Date   APPENDECTOMY     CESAREAN SECTION      OB History   No obstetric history on file.      Home Medications    Prior to Admission medications   Medication Sig Start Date End Date Taking? Authorizing Provider  fluticasone (FLONASE) 50 MCG/ACT nasal spray Place into both nostrils daily.   Yes [provider]  Fluticasone-Umeclidin-Vilant (TRELEGY ELLIPTA) 100-62.5-25 MCG/ACT AEPB Inhale into the lungs.   Yes [provider]  predniSONE (DELTASONE) 50 MG tablet Take once a day for 5 days.  Take with food 08/24/23  Yes Eustace Moore, MD  SUMAtriptan (IMITREX) 50 MG tablet Take 50 mg by mouth every 2 (two) hours as needed for migraine. May repeat in 2 hours if headache persists or recurs.   Yes [provider]  albuterol (VENTOLIN HFA) 108 (90 Base) MCG/ACT inhaler Inhale 1-2  puffs into the lungs every 6 (six) hours as needed for wheezing or shortness of breath. 06/08/22   Trevor Iha, FNP    Family History Family History  Problem Relation Age of Onset   Hypertension Mother    Asthma Mother    Cancer Father     Social History Social History   Tobacco Use   Smoking status: Former   Smokeless tobacco: Never  Vaping Use   Vaping status: Never Used  Substance Use Topics   Alcohol use: Not Currently   Drug use: Not Currently     Allergies   Codeine   Review of Systems Review of Systems  See HPI Physical Exam Triage Vital Signs ED Triage Vitals  Encounter Vitals Group     BP 08/24/23 0852 (!) 156/95     Systolic BP Percentile --      Diastolic BP Percentile --      Pulse Rate 08/24/23 0852 76     Resp 08/24/23 0852 16     Temp 08/24/23 0852 98.1 F (36.7 C)     Temp src --      SpO2 08/24/23 0852 97 %     Weight --      Height --      Head Circumference --      Peak  Flow --      Pain Score 08/24/23 0856 6     Pain Loc --      Pain Education --      Exclude from Growth Chart --    No data found.  Updated Vital Signs BP (!) 156/95   Pulse 76   Temp 98.1 F (36.7 C)   Resp 16   SpO2 97%       Physical Exam Constitutional:      General: She is not in acute distress.    Appearance: She is well-developed. She is not ill-appearing.  HENT:     Head: Normocephalic and atraumatic.  Eyes:     Conjunctiva/sclera: Conjunctivae normal.     Pupils: Pupils are equal, round, and reactive to light.  Cardiovascular:     Rate and Rhythm: Normal rate.     Heart sounds: Normal heart sounds. No murmur heard. Pulmonary:     Effort: Pulmonary effort is normal. No respiratory distress.     Breath sounds: Normal breath sounds.  Chest:     Chest wall: No tenderness.     Comments: Tenderness at the left sternal costal border at rib 2 and 3 Abdominal:     General: There is no distension.     Palpations: Abdomen is soft.   Musculoskeletal:        General: Normal range of motion.     Cervical back: Normal range of motion.     Comments: Tenderness in the left upper body of the trapezius muscle  Skin:    General: Skin is warm and dry.  Neurological:     Mental Status: She is alert.      UC Treatments / Results  Labs (all labs ordered are listed, but only abnormal results are displayed) Labs Reviewed - No data to display  EKG Normal sinus rhythm.  Rate 72.  Normal axis and intervals No ST or T wave changes  Normal EKG  Radiology DG Chest 2 View Result Date: 08/24/2023 CLINICAL DATA:  Chest pain.  History of COPD. EXAM: CHEST - 2 VIEW COMPARISON:  06/08/2022 FINDINGS: The heart size and mediastinal contours are within normal limits. Both lungs are clear. The visualized skeletal structures are unremarkable. IMPRESSION: No active cardiopulmonary disease. Electronically Signed   By: Signa Kell M.D.   On: 08/24/2023 09:36    Procedures Procedures (including critical care time)  Medications Ordered in UC Medications - No data to display  Initial Impression / Assessment and Plan / UC Course  I have reviewed the triage vital signs and the nursing notes.  Pertinent labs & imaging results that were available during my care of the patient were reviewed by me and considered in my medical decision making (see chart for details).     Discussed with patient she has chest wall pain.  It is musculoskeletal in nature.  Will treat with prednisone.  Return as needed Final Clinical Impressions(s) / UC Diagnoses   Final diagnoses:  Chest pain, unspecified type  Acute chest wall pain     Discharge Instructions      Chest x-ray is normal EKG is normal I believe the chest and arm pain are due to musculoskeletal strain Take prednisone once a day for 5 days Limit heavy activity Return as needed     ED Prescriptions     Medication Sig Dispense Auth. Provider   predniSONE (DELTASONE) 50 MG tablet  Take once a day for 5 days.  Take with food 5  tablet Eustace Moore, MD      PDMP not reviewed this encounter.   Eustace Moore, MD 08/24/23 [phone]

## 2023-08-24 NOTE — ED Triage Notes (Signed)
 X couple of days has had left chest/arm/back soreness. Hx emphysema/copd per patient. No n/v/diaphoresis. Soreness is constant in nature. Has not taken otc meds.

## 2023-08-24 NOTE — Discharge Instructions (Addendum)
 Chest x-ray is normal EKG is normal I believe the chest and arm pain are due to musculoskeletal strain Take prednisone once a day for 5 days Limit heavy activity Return as needed

## 2023-12-22 IMAGING — US US EXTREM LOW VENOUS*R*
1 series · 13 of 24 positions shown · non-contrast
Comparison: None.

CLINICAL DATA: 60-year-old female with a history of right lower leg
swelling



[Series 1: us venous img lower uni right (dvt) · portal-venous · 13 of 41 slices shown]
[im 1/41]
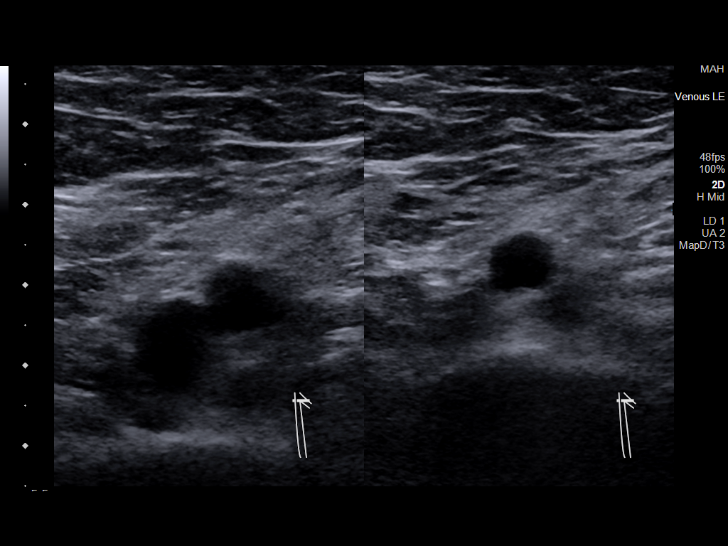
[im 4/41]
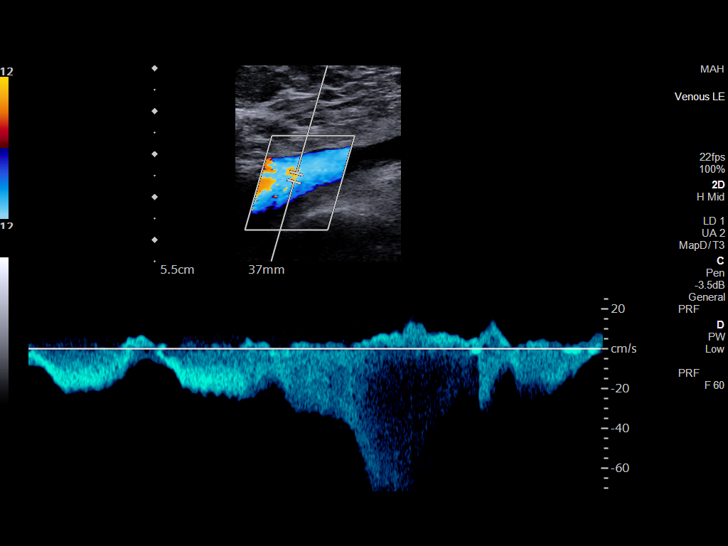
[im 7/41]
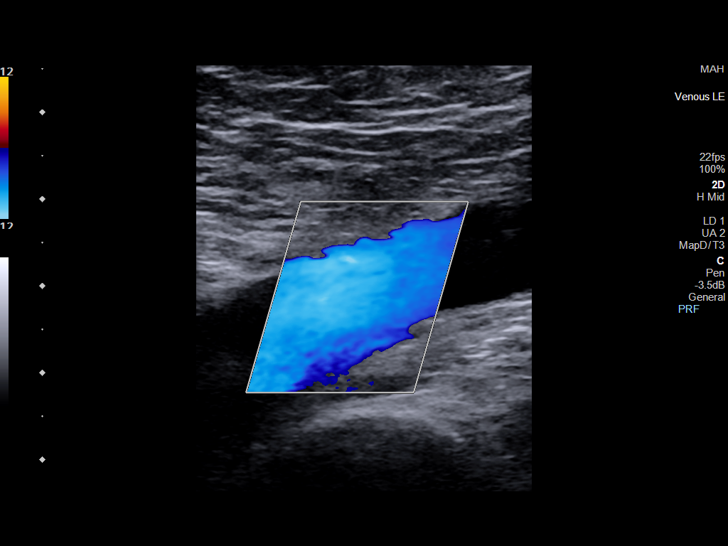
[im 11/41]
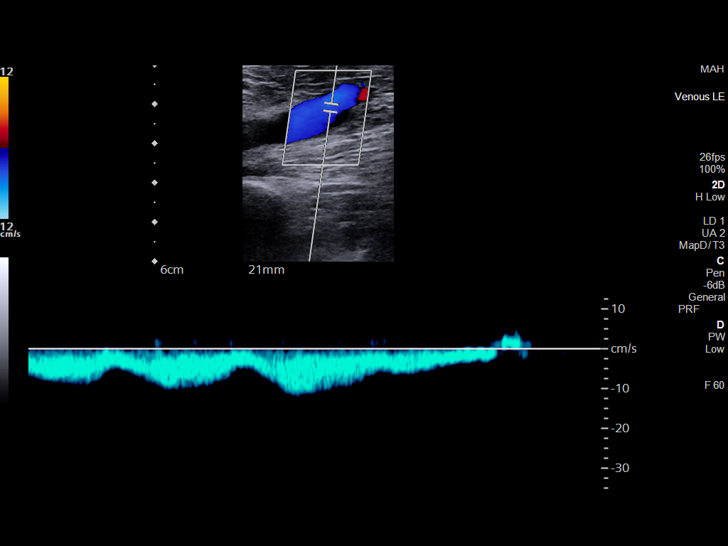
[im 14/41]
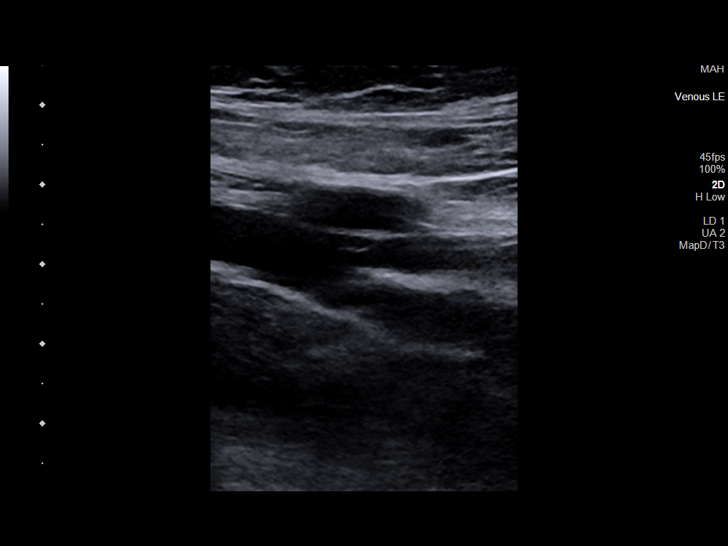
[im 18/41]
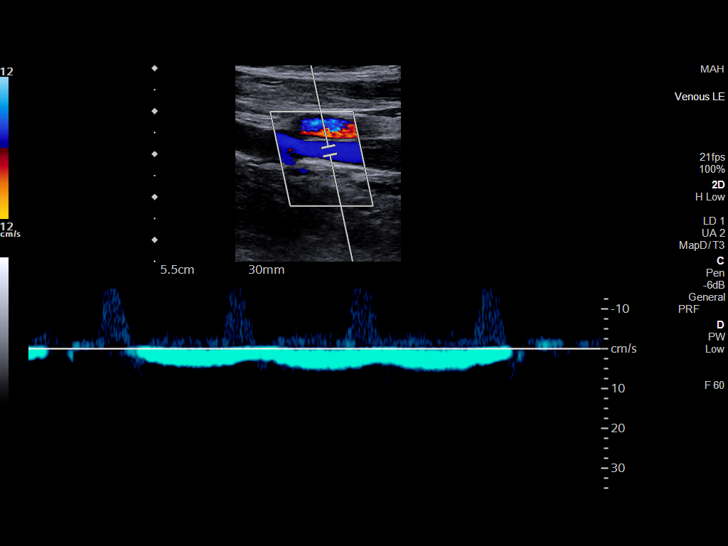
[im 21/41]
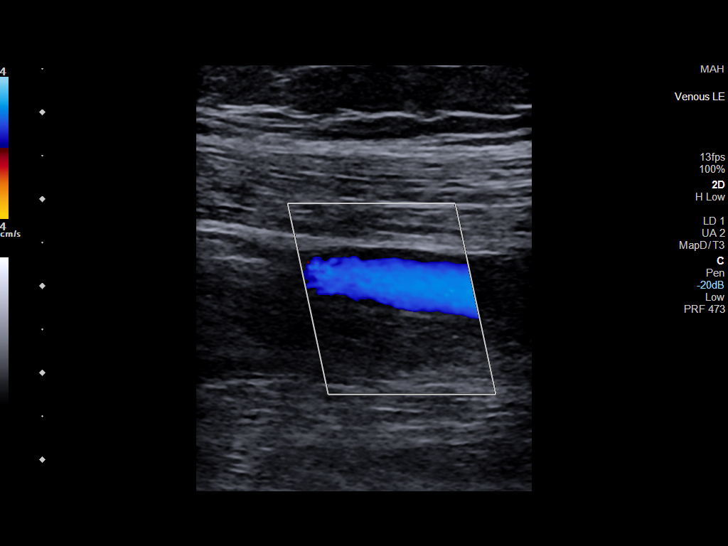
[im 23/41]
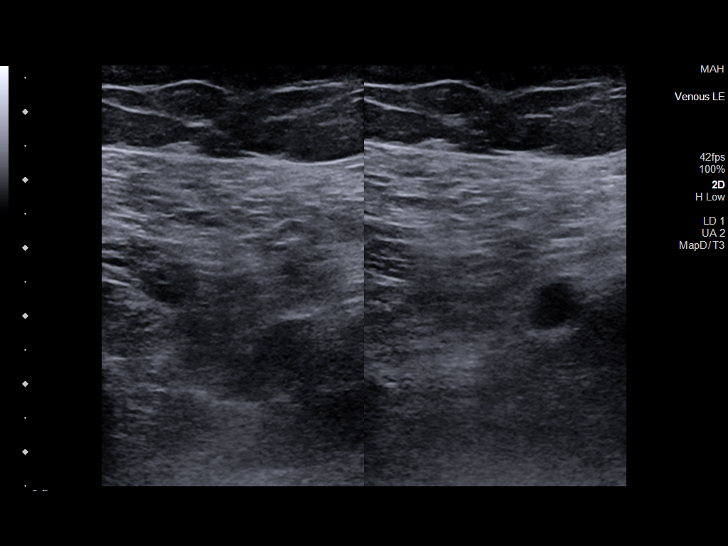
[im 27/41]
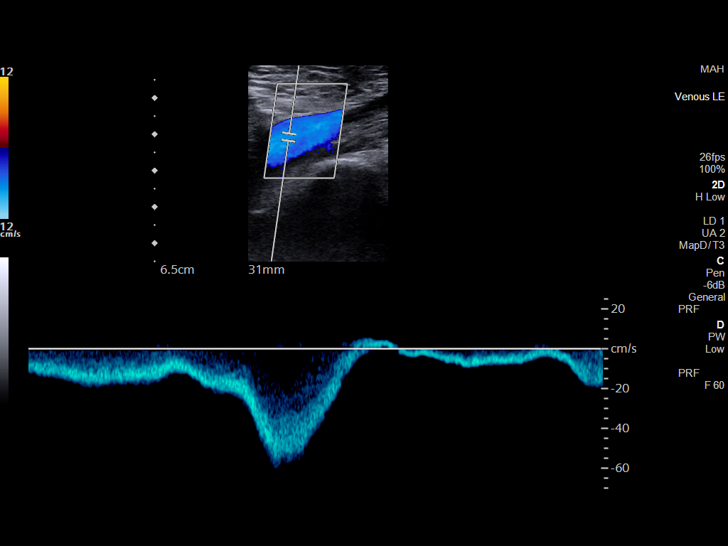
[im 30/41]
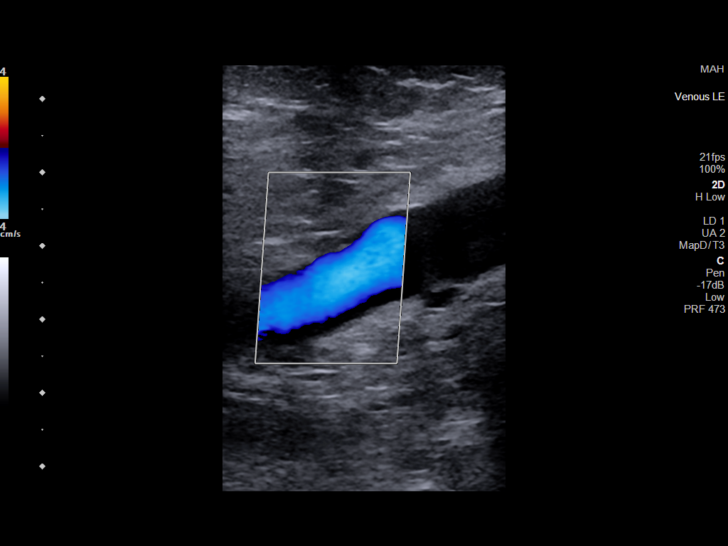
[im 34/41]
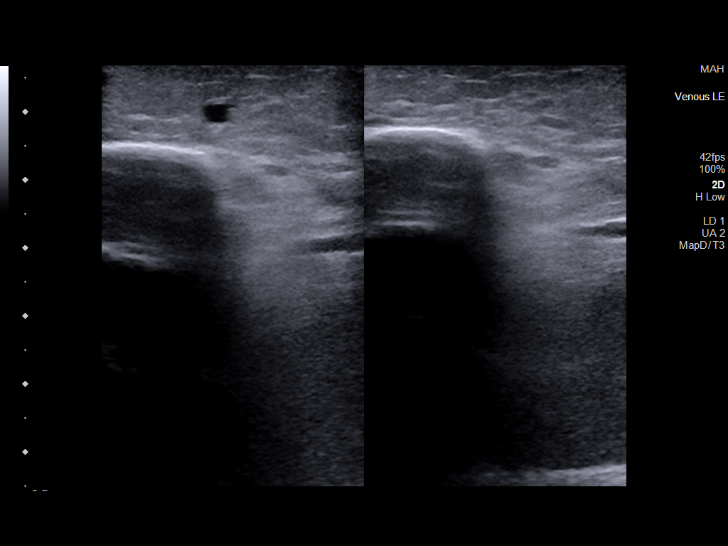
[im 37/41]
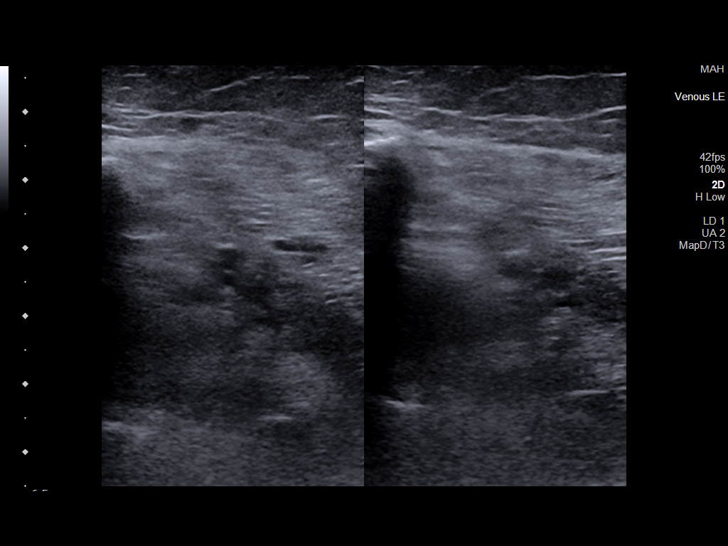
[im 41/41]
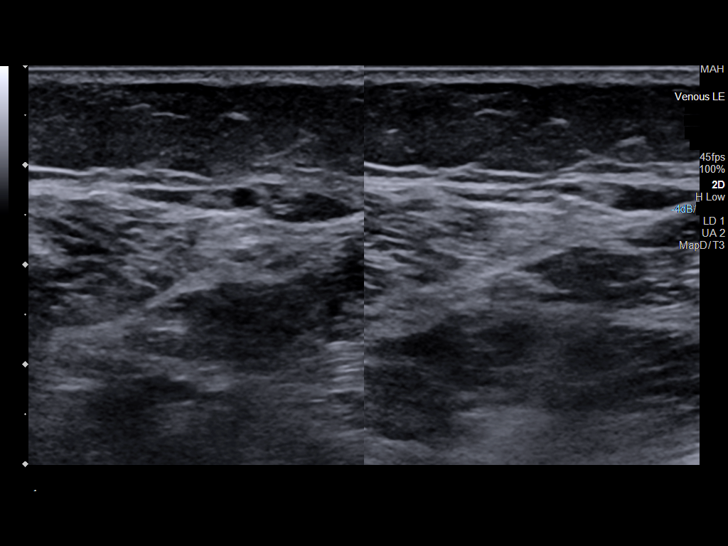

[13 of 24 positions shown; findings below may reference images not displayed]

FINDINGS: Contralateral Common Femoral Vein: Respiratory phasicity is normal
and symmetric with the symptomatic side. No evidence of thrombus.
Normal compressibility.

Common Femoral Vein: No evidence of thrombus. Normal
compressibility, respiratory phasicity and response to augmentation.

Saphenofemoral Junction: No evidence of thrombus. Normal
compressibility and flow on color Doppler imaging.

Profunda Femoral Vein: No evidence of thrombus. Normal
compressibility and flow on color Doppler imaging.

Femoral Vein: No evidence of thrombus. Normal compressibility,
respiratory phasicity and response to augmentation.

Popliteal Vein: No evidence of thrombus. Normal compressibility,
respiratory phasicity and response to augmentation.

Calf Veins: Limited visualization of the peroneal vein. Posterior
tibial veins patent and compressible with flow maintained

Superficial Great Saphenous Vein: No evidence of thrombus. Normal
compressibility and flow on color Doppler imaging.

Other Findings:  None.
IMPRESSION: Directed duplex of the right lower extremity negative for DVT

## 2023-12-31 ENCOUNTER — Ambulatory Visit
Admission: EM | Admit: 2023-12-31 | Discharge: 2023-12-31 | Disposition: A | Discharge status: Home / Self Care | Attending: Family Medicine | Admitting: Family Medicine

## 2023-12-31 ENCOUNTER — Encounter: Payer: Self-pay | Admitting: Emergency Medicine

## 2023-12-31 DIAGNOSIS — L02419 Cutaneous abscess of limb, unspecified: Secondary | ICD-10-CM | POA: Diagnosis not present

## 2023-12-31 DIAGNOSIS — L03119 Cellulitis of unspecified part of limb: Secondary | ICD-10-CM

## 2023-12-31 MED ORDER — DOXYCYCLINE HYCLATE 100 MG PO CAPS: 100.0000 mg | ORAL_CAPSULE | Freq: Two times a day (BID) | ORAL | 0 refills | Status: DC

## 2023-12-31 MED ORDER — HYDROCODONE-ACETAMINOPHEN 5-325 MG PO TABS: 1.0000 | ORAL_TABLET | Freq: Four times a day (QID) | ORAL | 0 refills | Status: DC | PRN

## 2023-12-31 NOTE — ED Triage Notes (Signed)
 Patient c/o possible insect bite on the right inside of her thigh x 2 days.  It has a red area around the outside of it with a small dot in the middle.  The area is tender to touch.

## 2023-12-31 NOTE — ED Provider Notes (Signed)
 [name]    CSN: [id number] Arrival date & time: 12/31/23  1459      History   Chief Complaint Chief Complaint  Patient presents with   Insect Bite    HPI Penny Jenkins is a 63 y.o. female.   HPI  Patient states that she just got back from vacation on Sunday.  On Monday or Tuesday noticed a red bump on the inside of her thigh.  Now it is quite large and very painful.  She thinks it may have been an insect bite.  No drainage.  States she definitely did not see any insect, spider, or tick Patient states that she was in general not feeling well with a little achiness and stomach upset.  No fever or chills  Past Medical History:  Diagnosis Date   COPD (chronic obstructive pulmonary disease) (HCC)    Emphysema lung (HCC)     There are no active problems to display for this patient.   Past Surgical History:  Procedure Laterality Date   APPENDECTOMY     CESAREAN SECTION      OB History   No obstetric history on file.      Home Medications    Prior to Admission medications   Medication Sig Start Date End Date Taking? Authorizing Provider  albuterol  (VENTOLIN  HFA) 108 (90 Base) MCG/ACT inhaler Inhale 1-2 puffs into the lungs every 6 (six) hours as needed for wheezing or shortness of breath. 06/08/22  Yes Teddy Sharper, FNP  doxycycline  (VIBRAMYCIN ) 100 MG capsule Take 1 capsule (100 mg total) by mouth 2 (two) times daily. 12/31/23  Yes Maranda Jamee Jacob, MD  fluticasone (FLONASE) 50 MCG/ACT nasal spray Place into both nostrils daily.   Yes [provider]  Fluticasone-Umeclidin-Vilant (TRELEGY ELLIPTA) 100-62.5-25 MCG/ACT AEPB Inhale into the lungs.   Yes [provider]  HYDROcodone -acetaminophen  (NORCO/VICODIN) 5-325 MG tablet Take 1-2 tablets by mouth every 6 (six) hours as needed. 12/31/23  Yes Maranda Jamee Jacob, MD  SUMAtriptan (IMITREX) 50 MG tablet Take 50 mg by mouth every 2 (two) hours as needed for migraine. May repeat  in 2 hours if headache persists or recurs.   Yes [provider]    Family History Family History  Problem Relation Age of Onset   Hypertension Mother    Asthma Mother    Cancer Father     Social History Social History   Tobacco Use   Smoking status: Former   Smokeless tobacco: Never  Vaping Use   Vaping status: Never Used  Substance Use Topics   Alcohol use: Not Currently   Drug use: Not Currently     Allergies   Codeine   Review of Systems Review of Systems   Physical Exam Triage Vital Signs ED Triage Vitals  Encounter Vitals Group     BP 12/31/23 1508 134/89     Girls Systolic BP Percentile --      Girls Diastolic BP Percentile --      Boys Systolic BP Percentile --      Boys Diastolic BP Percentile --      Pulse Rate 12/31/23 1508 79     Resp 12/31/23 1508 18     Temp 12/31/23 1508 98.8 F (37.1 C)     Temp Source 12/31/23 1508 Oral     SpO2 12/31/23 1508 95 %     Weight 12/31/23 1510 170 lb (77.1 kg)     Height 12/31/23 1510 5' 5 (1.651 m)  Head Circumference --      Peak Flow --      Pain Score 12/31/23 1510 8     Pain Loc --      Pain Education --      Exclude from Growth Chart --    No data found.  Updated Vital Signs BP 134/89 (BP Location: Right Arm)   Pulse 79   Temp 98.8 F (37.1 C) (Oral)   Resp 18   Ht 5' 5 (1.651 m)   Wt 77.1 kg   SpO2 95%   BMI 28.29 kg/m       Physical Exam Constitutional:      General: She is not in acute distress.    Appearance: She is well-developed.  HENT:     Head: Normocephalic and atraumatic.  Eyes:     Conjunctiva/sclera: Conjunctivae normal.     Pupils: Pupils are equal, round, and reactive to light.  Cardiovascular:     Rate and Rhythm: Normal rate.  Pulmonary:     Effort: Pulmonary effort is normal. No respiratory distress.  Abdominal:     General: There is no distension.     Palpations: Abdomen is soft.  Musculoskeletal:        General: Normal range of motion.      Cervical back: Normal range of motion.  Skin:    General: Skin is warm and dry.     Findings: Erythema present.     Comments: Patient has a large area of erythema on the inside of her right thigh that measures 15 cm across.  Indurated.  Warm.  Very tender.  In the center there is a nodule that measures 3 cm across with a deeper erythema.  No fluctuance.  Neurological:     Mental Status: She is alert.      UC Treatments / Results  Labs (all labs ordered are listed, but only abnormal results are displayed) Labs Reviewed - No data to display  EKG   Radiology No results found.  Procedures Procedures (including critical care time)  Medications Ordered in UC Medications - No data to display  Initial Impression / Assessment and Plan / UC Course  I have reviewed the triage vital signs and the nursing notes.  Pertinent labs & imaging results that were available during my care of the patient were reviewed by me and considered in my medical decision making (see chart for details).     Uncertain whether this is an insect bite or early abscess formation.  With the amount of tenderness, and her malaise I favor infection of her allergic reaction.  Will treat with antibiotics and pain medicine.  Was advised to go to the emergency room if worse Final Clinical Impressions(s) / UC Diagnoses   Final diagnoses:  Cellulitis and abscess of leg     Discharge Instructions      Take the antibiotic 2 times a day.  Take this antibiotic with food.  I would take 2 doses today Take Tylenol  for moderate pain.  If pain is severe you can take the hydrocodone .  Do not drive on hydrocodone  If this area gets worse instead of better, or if you start running fevers you should go to the emergency room   ED Prescriptions     Medication Sig Dispense Auth. Provider   doxycycline  (VIBRAMYCIN ) 100 MG capsule Take 1 capsule (100 mg total) by mouth 2 (two) times daily. 14 capsule Maranda Jamee Jacob, MD    HYDROcodone -acetaminophen  (NORCO/VICODIN) 5-325 MG tablet  Take 1-2 tablets by mouth every 6 (six) hours as needed. 10 tablet Maranda Jamee Jacob, MD      I have reviewed the PDMP during this encounter.   Maranda Jamee Jacob, MD 12/31/23 [phone]

## 2023-12-31 NOTE — Discharge Instructions (Signed)
 Take the antibiotic 2 times a day.  Take this antibiotic with food.  I would take 2 doses today Take Tylenol  for moderate pain.  If pain is severe you can take the hydrocodone .  Do not drive on hydrocodone  If this area gets worse instead of better, or if you start running fevers you should go to the emergency room

## 2024-01-07 ENCOUNTER — Telehealth: Payer: Self-pay

## 2024-01-07 MED ORDER — DOXYCYCLINE HYCLATE 100 MG PO CAPS: 100.0000 mg | ORAL_CAPSULE | Freq: Two times a day (BID) | ORAL | 0 refills | Status: DC

## 2024-01-07 NOTE — Telephone Encounter (Signed)
 Pt called left a message with front desk asking for more medication. Forwarded message to NP on duty and relying patients request. Was told to call in more doxycycline  to pt pharmacy   Spoke to patient and informed patient she will receive more medication at her pharmacy but to come back in if area is worsening presenting with fever, worsening pain or redness. Educated pt on otc pain medication

## 2024-05-19 ENCOUNTER — Ambulatory Visit
Admission: EM | Admit: 2024-05-19 | Discharge: 2024-05-19 | Disposition: A | Discharge status: Home / Self Care | Attending: Family Medicine | Admitting: Family Medicine

## 2024-05-19 ENCOUNTER — Ambulatory Visit (INDEPENDENT_AMBULATORY_CARE_PROVIDER_SITE_OTHER)

## 2024-05-19 ENCOUNTER — Encounter: Payer: Self-pay | Admitting: Emergency Medicine

## 2024-05-19 DIAGNOSIS — M79672 Pain in left foot: Secondary | ICD-10-CM | POA: Diagnosis not present

## 2024-05-19 DIAGNOSIS — M722 Plantar fascial fibromatosis: Secondary | ICD-10-CM

## 2024-05-19 DIAGNOSIS — M7732 Calcaneal spur, left foot: Secondary | ICD-10-CM

## 2024-05-19 MED ORDER — METHYLPREDNISOLONE 4 MG PO TBPK: ORAL_TABLET | ORAL | 0 refills | Status: AC

## 2024-05-19 MED ORDER — NAPROXEN SODIUM 550 MG PO TABS: 550.0000 mg | ORAL_TABLET | Freq: Two times a day (BID) | ORAL | 0 refills | Status: AC

## 2024-05-19 NOTE — Discharge Instructions (Signed)
 I have included written information about plantar fasciitis and plantar fasciitis exercises Take the steroid as directed After you finish the steroid take the naproxen twice a day with food Ice and stretching as much as you can tolerate  May need podiatry or sports medicine if you fail to improve

## 2024-05-19 NOTE — ED Provider Notes (Signed)
 [facility]    CSN: [id number] Arrival date & time: 05/19/24  1143      History   Chief Complaint Chief Complaint  Patient presents with   Foot Pain    HPI Penny Jenkins is a 63 y.o. female.   Patient states she is on her feet at her job.  She is working 6 days a week.  She has heel pain on the left for the last couple of weeks.  It is getting worse.  She can hardly put weight on it.  Here for evaluation.  No history of foot problems, plantar fasciitis, stress fracture.  Denies trauma    Past Medical History:  Diagnosis Date   COPD (chronic obstructive pulmonary disease) (HCC)    Emphysema lung (HCC)     There are no active problems to display for this patient.   Past Surgical History:  Procedure Laterality Date   APPENDECTOMY     CESAREAN SECTION      OB History   No obstetric history on file.      Home Medications    Prior to Admission medications   Medication Sig Start Date End Date Taking? Authorizing Provider  fluticasone (FLONASE) 50 MCG/ACT nasal spray Place into both nostrils daily.   Yes [provider]  Fluticasone-Umeclidin-Vilant (TRELEGY ELLIPTA) 100-62.5-25 MCG/ACT AEPB Inhale into the lungs.   Yes [provider]  methylPREDNISolone (MEDROL DOSEPAK) 4 MG TBPK tablet tad 05/19/24  Yes Maranda Jamee Jacob, MD  naproxen sodium (ANAPROX DS) 550 MG tablet Take 1 tablet (550 mg total) by mouth 2 (two) times daily with a meal. 05/19/24  Yes Maranda Jamee Jacob, MD  SUMAtriptan (IMITREX) 50 MG tablet Take 50 mg by mouth every 2 (two) hours as needed for migraine. May repeat in 2 hours if headache persists or recurs.   Yes [provider]    Family History Family History  Problem Relation Age of Onset   Hypertension Mother    Asthma Mother    Cancer Father     Social History Social History   Tobacco Use   Smoking status: Former   Smokeless tobacco: Never  Vaping Use   Vaping status: Never Used   Substance Use Topics   Alcohol use: Not Currently   Drug use: Not Currently     Allergies   Codeine   Review of Systems Review of Systems See HPI  Physical Exam Triage Vital Signs ED Triage Vitals  Encounter Vitals Group     BP 05/19/24 1155 (!) 136/97     Girls Systolic BP Percentile --      Girls Diastolic BP Percentile --      Boys Systolic BP Percentile --      Boys Diastolic BP Percentile --      Pulse Rate 05/19/24 1155 89     Resp 05/19/24 1155 18     Temp 05/19/24 1155 98.4 F (36.9 C)     Temp Source 05/19/24 1155 Oral     SpO2 05/19/24 1155 96 %     Weight 05/19/24 1154 170 lb (77.1 kg)     Height 05/19/24 1154 5' 5 (1.651 m)     Head Circumference --      Peak Flow --      Pain Score 05/19/24 1154 10     Pain Loc --      Pain Education --      Exclude from Growth Chart --    No data found.  Updated Vital Signs BP (!) 136/97 (BP Location: Right Arm)   Pulse 89   Temp 98.4 F (36.9 C) (Oral)   Resp 18   Ht 5' 5 (1.651 m)   Wt 77.1 kg   SpO2 96%   BMI 28.29 kg/m       Physical Exam Constitutional:      General: She is not in acute distress.    Appearance: She is well-developed.  HENT:     Head: Normocephalic and atraumatic.  Eyes:     Conjunctiva/sclera: Conjunctivae normal.     Pupils: Pupils are equal, round, and reactive to light.  Cardiovascular:     Rate and Rhythm: Normal rate.  Pulmonary:     Effort: Pulmonary effort is normal. No respiratory distress.  Abdominal:     General: There is no distension.     Palpations: Abdomen is soft.  Musculoskeletal:        General: Tenderness present. Normal range of motion.     Cervical back: Normal range of motion.     Comments: Tenderness in the medial heel at the plantar fascia insertion.  Slight warmth and swelling noted in the area.  Tenderness also to squeeze pressure of heel.  Remainder of foot exam is normal  Skin:    General: Skin is warm and dry.  Neurological:     Mental  Status: She is alert.     Gait: Gait abnormal.      UC Treatments / Results  Labs (all labs ordered are listed, but only abnormal results are displayed) Labs Reviewed - No data to display  EKG   Radiology DG Os Calcis Left Result Date: 05/19/2024 CLINICAL DATA:  Left heel pain for 3 weeks. EXAM: LEFT OS CALCIS - 2+ VIEW COMPARISON:  None Available. FINDINGS: No acute fracture. Plantar calcaneal spur at the origin of the central cord of the plantar fascia. Minimal calcaneal enthesopathy at the insertion of the Achilles tendon. Soft tissues are unremarkable. IMPRESSION: 1. No acute osseous abnormality. 2. Plantar calcaneal spur at the origin of the central cord of the plantar fascia. Electronically Signed   By: Harrietta Sherry M.D.   On: 05/19/2024 13:03    Procedures Procedures (including critical care time)  Medications Ordered in UC Medications - No data to display  Initial Impression / Assessment and Plan / UC Course  I have reviewed the triage vital signs and the nursing notes.  Pertinent labs & imaging results that were available during my care of the patient were reviewed by me and considered in my medical decision making (see chart for details).     Discussed home care Final Clinical Impressions(s) / UC Diagnoses   Final diagnoses:  Pain of left heel  Plantar fasciitis of left foot  Heel spur, left     Discharge Instructions      I have included written information about plantar fasciitis and plantar fasciitis exercises Take the steroid as directed After you finish the steroid take the naproxen twice a day with food Ice and stretching as much as you can tolerate  May need podiatry or sports medicine if you fail to improve     ED Prescriptions     Medication Sig Dispense Auth. Provider   methylPREDNISolone (MEDROL DOSEPAK) 4 MG TBPK tablet tad 21 tablet Maranda Jamee Jacob, MD   naproxen sodium (ANAPROX DS) 550 MG tablet Take 1 tablet (550 mg total) by  mouth 2 (two) times daily with a meal. 30 tablet Maranda Jamee  Beverley, MD      PDMP not reviewed this encounter.   Maranda Jamee Beverley, MD 05/19/24 [phone]

## 2024-05-19 NOTE — ED Triage Notes (Signed)
 Patient c/o left heel pain for several weeks.  Patient does stand on her feet for work and thought it might be her shoes.  She changed out shoes and no improvement.  No apparent injury.  Patient has taken Ibuprofen.
